# Patient Record
Sex: Female | Born: 1965 | Race: White | Hispanic: No | Marital: Married | Smoking: Never smoker
Health system: Southern US, Community
[De-identification: ages and names within clinical notes are randomized; demographics above are authoritative.]

## PROBLEM LIST (undated history)

## (undated) DIAGNOSIS — I8390 Asymptomatic varicose veins of unspecified lower extremity: Secondary | ICD-10-CM

## (undated) DIAGNOSIS — G43909 Migraine, unspecified, not intractable, without status migrainosus: Secondary | ICD-10-CM

## (undated) DIAGNOSIS — K589 Irritable bowel syndrome without diarrhea: Secondary | ICD-10-CM

## (undated) DIAGNOSIS — J189 Pneumonia, unspecified organism: Secondary | ICD-10-CM

## (undated) DIAGNOSIS — J45909 Unspecified asthma, uncomplicated: Secondary | ICD-10-CM

## (undated) HISTORY — DX: Pneumonia, unspecified organism: J18.9

## (undated) HISTORY — DX: Migraine, unspecified, not intractable, without status migrainosus: G43.909

## (undated) HISTORY — DX: Asymptomatic varicose veins of unspecified lower extremity: I83.90

## (undated) HISTORY — DX: Unspecified asthma, uncomplicated: J45.909

## (undated) HISTORY — DX: Irritable bowel syndrome, unspecified: K58.9

## (undated) HISTORY — PX: WISDOM TOOTH EXTRACTION: SHX21

---

## 2002-04-13 ENCOUNTER — Other Ambulatory Visit: Admission: RE | Admit: 2002-04-13 | Discharge: 2002-04-13 | Payer: Self-pay | Admitting: Obstetrics & Gynecology

## 2002-05-08 ENCOUNTER — Encounter: Admission: RE | Admit: 2002-05-08 | Discharge: 2002-06-29 | Payer: Self-pay | Admitting: Internal Medicine

## 2003-08-08 ENCOUNTER — Other Ambulatory Visit: Admission: RE | Admit: 2003-08-08 | Discharge: 2003-08-08 | Payer: Self-pay | Admitting: Obstetrics and Gynecology

## 2010-04-10 ENCOUNTER — Encounter: Admission: RE | Admit: 2010-04-10 | Discharge: 2010-04-10 | Payer: Self-pay | Admitting: Obstetrics and Gynecology

## 2010-04-22 ENCOUNTER — Encounter: Admission: RE | Admit: 2010-04-22 | Discharge: 2010-04-22 | Payer: Self-pay | Admitting: Obstetrics and Gynecology

## 2011-04-27 ENCOUNTER — Other Ambulatory Visit: Payer: Self-pay | Admitting: Obstetrics and Gynecology

## 2011-04-27 DIAGNOSIS — Z1231 Encounter for screening mammogram for malignant neoplasm of breast: Secondary | ICD-10-CM

## 2011-06-03 ENCOUNTER — Ambulatory Visit
Admission: RE | Admit: 2011-06-03 | Discharge: 2011-06-03 | Disposition: A | Discharge status: Home / Self Care | Payer: Managed Care, Other (non HMO) | Source: Ambulatory Visit | Attending: Obstetrics and Gynecology | Admitting: Obstetrics and Gynecology

## 2011-06-03 DIAGNOSIS — Z1231 Encounter for screening mammogram for malignant neoplasm of breast: Secondary | ICD-10-CM

## 2012-05-26 ENCOUNTER — Other Ambulatory Visit: Payer: Self-pay | Admitting: Obstetrics and Gynecology

## 2012-05-26 DIAGNOSIS — Z1231 Encounter for screening mammogram for malignant neoplasm of breast: Secondary | ICD-10-CM

## 2012-07-04 ENCOUNTER — Ambulatory Visit
Admission: RE | Admit: 2012-07-04 | Discharge: 2012-07-04 | Disposition: A | Discharge status: Home / Self Care | Payer: Managed Care, Other (non HMO) | Source: Ambulatory Visit | Attending: Obstetrics and Gynecology | Admitting: Obstetrics and Gynecology

## 2012-07-04 DIAGNOSIS — Z1231 Encounter for screening mammogram for malignant neoplasm of breast: Secondary | ICD-10-CM

## 2013-06-28 ENCOUNTER — Other Ambulatory Visit: Payer: Self-pay

## 2013-06-28 DIAGNOSIS — Z1231 Encounter for screening mammogram for malignant neoplasm of breast: Secondary | ICD-10-CM

## 2013-07-18 ENCOUNTER — Ambulatory Visit
Admission: RE | Admit: 2013-07-18 | Discharge: 2013-07-18 | Disposition: A | Discharge status: Home / Self Care | Payer: Managed Care, Other (non HMO) | Source: Ambulatory Visit

## 2013-07-18 DIAGNOSIS — Z1231 Encounter for screening mammogram for malignant neoplasm of breast: Secondary | ICD-10-CM

## 2013-09-01 ENCOUNTER — Other Ambulatory Visit: Payer: Self-pay | Admitting: Family

## 2013-09-01 ENCOUNTER — Ambulatory Visit
Admission: RE | Admit: 2013-09-01 | Discharge: 2013-09-01 | Disposition: A | Discharge status: Home / Self Care | Payer: Managed Care, Other (non HMO) | Source: Ambulatory Visit | Attending: Family | Admitting: Family

## 2013-09-01 DIAGNOSIS — R509 Fever, unspecified: Secondary | ICD-10-CM

## 2013-09-01 DIAGNOSIS — R059 Cough, unspecified: Secondary | ICD-10-CM

## 2013-09-01 DIAGNOSIS — R05 Cough: Secondary | ICD-10-CM

## 2014-06-05 ENCOUNTER — Other Ambulatory Visit: Payer: Self-pay | Admitting: *Deleted

## 2014-06-05 ENCOUNTER — Encounter: Payer: Self-pay | Admitting: Vascular Surgery

## 2014-06-05 DIAGNOSIS — I83812 Varicose veins of left lower extremities with pain: Secondary | ICD-10-CM

## 2014-06-14 ENCOUNTER — Encounter: Payer: Managed Care, Other (non HMO) | Admitting: Vascular Surgery

## 2014-06-14 ENCOUNTER — Encounter: Payer: Self-pay | Admitting: Vascular Surgery

## 2014-06-14 ENCOUNTER — Encounter (HOSPITAL_COMMUNITY): Payer: Managed Care, Other (non HMO)

## 2014-06-18 ENCOUNTER — Encounter: Payer: Self-pay | Admitting: Vascular Surgery

## 2014-06-18 ENCOUNTER — Ambulatory Visit (HOSPITAL_COMMUNITY)
Admission: RE | Admit: 2014-06-18 | Discharge: 2014-06-18 | Disposition: A | Discharge status: Home / Self Care | Payer: Managed Care, Other (non HMO) | Source: Ambulatory Visit | Attending: Vascular Surgery | Admitting: Vascular Surgery

## 2014-06-18 ENCOUNTER — Ambulatory Visit (INDEPENDENT_AMBULATORY_CARE_PROVIDER_SITE_OTHER): Payer: Managed Care, Other (non HMO) | Admitting: Vascular Surgery

## 2014-06-18 VITALS — BP 99/67 | HR 57 | Resp 14 | Ht 67.0 in | Wt 148.0 lb

## 2014-06-18 DIAGNOSIS — I83812 Varicose veins of left lower extremities with pain: Secondary | ICD-10-CM | POA: Insufficient documentation

## 2014-06-18 DIAGNOSIS — I83892 Varicose veins of left lower extremities with other complications: Secondary | ICD-10-CM

## 2014-06-18 NOTE — Progress Notes (Signed)
Subjective:     Patient ID: Latoya Roberts, female   DOB: 11/11/1965, 48 y.o.   MRN: [id number]  HPI this 48 year old female is evaluated for painful varicosities in the left leg. 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 She has aching discomfort behind the knees which worsens the more she stands or sits. Being in the supine position with legs elevated relieves her symptoms and walking also helps. She has no history of DVT, thrombophlebitis, stasis ulcers, or bleeding. She does not were elastic compression stockings. Her symptoms seem to worsen as the day progresses.  Past Medical History  Diagnosis Date  . Migraine headache   . Varicose veins     History  Substance Use Topics  . Smoking status: Never Smoker   . Smokeless tobacco: Never Used  . Alcohol Use: 0.0 oz/week    0 Not specified per week    Family History  Problem Relation Age of Onset  . Osteopenia Mother   . Hyperlipidemia Father   . Diabetes Maternal Grandfather     Allergies  Allergen Reactions  . Penicillins Rash    Current outpatient prescriptions: CALCIUM PO, Take by mouth daily., Disp: , Rfl: ;  Cholecalciferol (VITAMIN D PO), Take by mouth daily., Disp: , Rfl: ;  rizatriptan (MAXALT) 10 MG tablet, Take 10 mg by mouth daily as needed for migraine. May repeat in 2 hours if needed, Disp: , Rfl:   BP 99/67 mmHg  Pulse 57  Resp 14  Ht 5\' 7"  (1.702 m)  Wt 148 lb (67.132 kg)  BMI 23.17 kg/m2  Body mass index is 23.17 kg/(m^2).         Review of Systems denies chest pain, dyspnea on exertion, PND, orthopnea, hemoptysis, lateralizing weakness, aphasia,   amaurosis fugax, claudication. Patient does have occasional migraine headaches. Other systems negative and a complete review of systems Objective:   Physical Exam BP 99/67 mmHg  Pulse 57  Resp 14  Ht 5\' 7"  (1.702 m)  Wt 148 lb (67.132 kg)  BMI 23.17 kg/m2  Gen.-alert and oriented x3  in no apparent distress HEENT normal for age Lungs no rhonchi or wheezing Cardiovascular regular rhythm no murmurs carotid pulses 3+ palpable no bruits audible Abdomen soft nontender no palpable masses Musculoskeletal free of  major deformities Skin clear -no rashes Neurologic normal Lower extremities 3+ femoral and dorsalis pedis pulses palpable bilaterally with no edema Left leg has prominent nest of varicosities in the mid thigh medially and in the mid calf and distally beginning 15 cm above the medial malleolus extending down onto the lateral aspect of the foot. Mild edema. No hyperpigmentation or ulceration is noted. Also some prominent reticular veins in the anterior thigh.  Today I ordered a venous duplex exam of the left leg which I reviewed and interpreted. There is no DVT. There is gross reflux in the left great saphenous vein supplying these bulging varicosities and there is also some deep vein reflux.       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 Patient symptoms are affecting her daily living.    Plan:         #1 long leg elastic compression stockings 20-30 mm gradient #2 elevate legs as much as possible #3 ibuprofen daily on a regular basis for pain #4 return in 3 months-if no significant improvement then she would need #  1 laser ablation left great saphenous vein plus greater than 20 stab phlebectomy of painful varicosities followed by one or 2 courses of sclerotherapy Return in 3 months

## 2014-07-17 ENCOUNTER — Other Ambulatory Visit: Payer: Self-pay

## 2014-07-17 DIAGNOSIS — Z1231 Encounter for screening mammogram for malignant neoplasm of breast: Secondary | ICD-10-CM

## 2014-07-26 ENCOUNTER — Encounter (INDEPENDENT_AMBULATORY_CARE_PROVIDER_SITE_OTHER): Payer: Self-pay

## 2014-07-26 ENCOUNTER — Ambulatory Visit
Admission: RE | Admit: 2014-07-26 | Discharge: 2014-07-26 | Disposition: A | Discharge status: Home / Self Care | Payer: BLUE CROSS/BLUE SHIELD | Source: Ambulatory Visit

## 2014-07-26 DIAGNOSIS — Z1231 Encounter for screening mammogram for malignant neoplasm of breast: Secondary | ICD-10-CM

## 2014-09-17 ENCOUNTER — Encounter: Payer: Self-pay | Admitting: Vascular Surgery

## 2014-09-18 ENCOUNTER — Ambulatory Visit: Payer: Managed Care, Other (non HMO) | Admitting: Vascular Surgery

## 2017-12-20 DIAGNOSIS — Z6825 Body mass index (BMI) 25.0-25.9, adult: Secondary | ICD-10-CM | POA: Diagnosis not present

## 2017-12-20 DIAGNOSIS — Z1231 Encounter for screening mammogram for malignant neoplasm of breast: Secondary | ICD-10-CM | POA: Diagnosis not present

## 2017-12-20 DIAGNOSIS — Z01419 Encounter for gynecological examination (general) (routine) without abnormal findings: Secondary | ICD-10-CM | POA: Diagnosis not present

## 2017-12-20 DIAGNOSIS — Z1151 Encounter for screening for human papillomavirus (HPV): Secondary | ICD-10-CM | POA: Diagnosis not present

## 2018-02-23 DIAGNOSIS — F4323 Adjustment disorder with mixed anxiety and depressed mood: Secondary | ICD-10-CM | POA: Diagnosis not present

## 2018-03-02 DIAGNOSIS — F438 Other reactions to severe stress: Secondary | ICD-10-CM | POA: Diagnosis not present

## 2018-03-08 DIAGNOSIS — D2371 Other benign neoplasm of skin of right lower limb, including hip: Secondary | ICD-10-CM | POA: Diagnosis not present

## 2018-03-08 DIAGNOSIS — L570 Actinic keratosis: Secondary | ICD-10-CM | POA: Diagnosis not present

## 2018-03-08 DIAGNOSIS — L814 Other melanin hyperpigmentation: Secondary | ICD-10-CM | POA: Diagnosis not present

## 2018-03-08 DIAGNOSIS — D485 Neoplasm of uncertain behavior of skin: Secondary | ICD-10-CM | POA: Diagnosis not present

## 2018-03-10 DIAGNOSIS — F438 Other reactions to severe stress: Secondary | ICD-10-CM | POA: Diagnosis not present

## 2018-03-23 DIAGNOSIS — F438 Other reactions to severe stress: Secondary | ICD-10-CM | POA: Diagnosis not present

## 2018-03-31 DIAGNOSIS — F438 Other reactions to severe stress: Secondary | ICD-10-CM | POA: Diagnosis not present

## 2018-04-27 DIAGNOSIS — F438 Other reactions to severe stress: Secondary | ICD-10-CM | POA: Diagnosis not present

## 2018-04-28 DIAGNOSIS — D1801 Hemangioma of skin and subcutaneous tissue: Secondary | ICD-10-CM | POA: Diagnosis not present

## 2018-04-28 DIAGNOSIS — D2272 Melanocytic nevi of left lower limb, including hip: Secondary | ICD-10-CM | POA: Diagnosis not present

## 2018-04-28 DIAGNOSIS — L905 Scar conditions and fibrosis of skin: Secondary | ICD-10-CM | POA: Diagnosis not present

## 2018-04-28 DIAGNOSIS — L814 Other melanin hyperpigmentation: Secondary | ICD-10-CM | POA: Diagnosis not present

## 2018-05-06 DIAGNOSIS — F438 Other reactions to severe stress: Secondary | ICD-10-CM | POA: Diagnosis not present

## 2019-01-05 DIAGNOSIS — Z13220 Encounter for screening for lipoid disorders: Secondary | ICD-10-CM | POA: Diagnosis not present

## 2019-01-05 DIAGNOSIS — Z6824 Body mass index (BMI) 24.0-24.9, adult: Secondary | ICD-10-CM | POA: Diagnosis not present

## 2019-01-05 DIAGNOSIS — Z130 Encounter for screening for diseases of the blood and blood-forming organs and certain disorders involving the immune mechanism: Secondary | ICD-10-CM | POA: Diagnosis not present

## 2019-01-05 DIAGNOSIS — Z01419 Encounter for gynecological examination (general) (routine) without abnormal findings: Secondary | ICD-10-CM | POA: Diagnosis not present

## 2019-01-05 DIAGNOSIS — Z1231 Encounter for screening mammogram for malignant neoplasm of breast: Secondary | ICD-10-CM | POA: Diagnosis not present

## 2019-01-05 DIAGNOSIS — Z1329 Encounter for screening for other suspected endocrine disorder: Secondary | ICD-10-CM | POA: Diagnosis not present

## 2019-01-05 DIAGNOSIS — Z0000 Encounter for general adult medical examination without abnormal findings: Secondary | ICD-10-CM | POA: Diagnosis not present

## 2019-01-09 DIAGNOSIS — H0015 Chalazion left lower eyelid: Secondary | ICD-10-CM | POA: Diagnosis not present

## 2019-01-09 DIAGNOSIS — H0102B Squamous blepharitis left eye, upper and lower eyelids: Secondary | ICD-10-CM | POA: Diagnosis not present

## 2019-01-09 DIAGNOSIS — H0102A Squamous blepharitis right eye, upper and lower eyelids: Secondary | ICD-10-CM | POA: Diagnosis not present

## 2019-05-04 DIAGNOSIS — Z23 Encounter for immunization: Secondary | ICD-10-CM | POA: Diagnosis not present

## 2019-05-04 DIAGNOSIS — D2272 Melanocytic nevi of left lower limb, including hip: Secondary | ICD-10-CM | POA: Diagnosis not present

## 2019-05-04 DIAGNOSIS — L814 Other melanin hyperpigmentation: Secondary | ICD-10-CM | POA: Diagnosis not present

## 2019-05-04 DIAGNOSIS — D1801 Hemangioma of skin and subcutaneous tissue: Secondary | ICD-10-CM | POA: Diagnosis not present

## 2019-07-13 DIAGNOSIS — M545 Low back pain: Secondary | ICD-10-CM | POA: Diagnosis not present

## 2019-07-13 DIAGNOSIS — R946 Abnormal results of thyroid function studies: Secondary | ICD-10-CM | POA: Diagnosis not present

## 2019-07-13 DIAGNOSIS — Z1331 Encounter for screening for depression: Secondary | ICD-10-CM | POA: Diagnosis not present

## 2019-07-13 DIAGNOSIS — G43909 Migraine, unspecified, not intractable, without status migrainosus: Secondary | ICD-10-CM | POA: Diagnosis not present

## 2019-07-14 ENCOUNTER — Other Ambulatory Visit: Payer: Self-pay | Admitting: Internal Medicine

## 2019-07-14 ENCOUNTER — Ambulatory Visit
Admission: RE | Admit: 2019-07-14 | Discharge: 2019-07-14 | Disposition: A | Discharge status: Home / Self Care | Payer: BC Managed Care – PPO | Source: Ambulatory Visit | Attending: Internal Medicine | Admitting: Internal Medicine

## 2019-07-14 DIAGNOSIS — M545 Low back pain, unspecified: Secondary | ICD-10-CM

## 2019-07-14 DIAGNOSIS — M25551 Pain in right hip: Secondary | ICD-10-CM | POA: Diagnosis not present

## 2019-07-19 DIAGNOSIS — R945 Abnormal results of liver function studies: Secondary | ICD-10-CM | POA: Diagnosis not present

## 2019-07-19 DIAGNOSIS — G43909 Migraine, unspecified, not intractable, without status migrainosus: Secondary | ICD-10-CM | POA: Diagnosis not present

## 2019-07-21 DIAGNOSIS — M545 Low back pain: Secondary | ICD-10-CM | POA: Diagnosis not present

## 2019-07-28 DIAGNOSIS — M545 Low back pain: Secondary | ICD-10-CM | POA: Diagnosis not present

## 2019-08-02 DIAGNOSIS — M545 Low back pain: Secondary | ICD-10-CM | POA: Diagnosis not present

## 2019-08-18 DIAGNOSIS — M545 Low back pain: Secondary | ICD-10-CM | POA: Diagnosis not present

## 2019-08-25 DIAGNOSIS — M545 Low back pain: Secondary | ICD-10-CM | POA: Diagnosis not present

## 2019-09-06 DIAGNOSIS — M545 Low back pain: Secondary | ICD-10-CM | POA: Diagnosis not present

## 2019-09-29 DIAGNOSIS — M545 Low back pain: Secondary | ICD-10-CM | POA: Diagnosis not present

## 2019-10-05 DIAGNOSIS — E038 Other specified hypothyroidism: Secondary | ICD-10-CM | POA: Diagnosis not present

## 2019-10-05 DIAGNOSIS — Z0000 Encounter for general adult medical examination without abnormal findings: Secondary | ICD-10-CM | POA: Diagnosis not present

## 2019-10-10 DIAGNOSIS — K921 Melena: Secondary | ICD-10-CM | POA: Diagnosis not present

## 2019-10-12 DIAGNOSIS — R195 Other fecal abnormalities: Secondary | ICD-10-CM | POA: Diagnosis not present

## 2019-10-12 DIAGNOSIS — M545 Low back pain: Secondary | ICD-10-CM | POA: Diagnosis not present

## 2019-10-12 DIAGNOSIS — Z0000 Encounter for general adult medical examination without abnormal findings: Secondary | ICD-10-CM | POA: Diagnosis not present

## 2019-10-12 DIAGNOSIS — E038 Other specified hypothyroidism: Secondary | ICD-10-CM | POA: Diagnosis not present

## 2019-10-12 DIAGNOSIS — G43909 Migraine, unspecified, not intractable, without status migrainosus: Secondary | ICD-10-CM | POA: Diagnosis not present

## 2019-10-13 DIAGNOSIS — M545 Low back pain: Secondary | ICD-10-CM | POA: Diagnosis not present

## 2019-10-16 ENCOUNTER — Encounter: Payer: Self-pay | Admitting: Gastroenterology

## 2019-10-20 DIAGNOSIS — G43909 Migraine, unspecified, not intractable, without status migrainosus: Secondary | ICD-10-CM | POA: Insufficient documentation

## 2019-10-20 DIAGNOSIS — E039 Hypothyroidism, unspecified: Secondary | ICD-10-CM | POA: Insufficient documentation

## 2019-10-20 DIAGNOSIS — R195 Other fecal abnormalities: Secondary | ICD-10-CM | POA: Insufficient documentation

## 2019-10-24 ENCOUNTER — Other Ambulatory Visit: Payer: Self-pay

## 2019-10-24 ENCOUNTER — Encounter: Payer: Self-pay | Admitting: Gastroenterology

## 2019-10-24 ENCOUNTER — Ambulatory Visit (INDEPENDENT_AMBULATORY_CARE_PROVIDER_SITE_OTHER): Payer: BC Managed Care – PPO | Admitting: Gastroenterology

## 2019-10-24 VITALS — BP 94/66 | HR 80 | Temp 98.0°F | Ht 66.5 in | Wt 159.1 lb

## 2019-10-24 DIAGNOSIS — M545 Low back pain: Secondary | ICD-10-CM | POA: Diagnosis not present

## 2019-10-24 DIAGNOSIS — R195 Other fecal abnormalities: Secondary | ICD-10-CM

## 2019-10-24 MED ORDER — SUPREP BOWEL PREP KIT 17.5-3.13-1.6 GM/177ML PO SOLN: 1.0000 | ORAL | 0 refills | Status: DC

## 2019-10-24 NOTE — Progress Notes (Signed)
HPI: This is a very pleasant 54 year old woman who was referred to me by Melida Quitter, MD  to evaluate Hemoccult positive stool.    As part of a routine physical she had Hemoccult testing of her stool and it was positive.  She never sees overt blood in her stool.  She does have very mild chronic constipation.  She has tried fiber supplements in the past and they tend to disagree with her.  Her weight is overall stable.  She has been having some low back pains and right hip pains but no abdominal pains.  Colon cancer does not run in her family  Old Data Reviewed:  Lab tests through PCP April 2021 show normal CBC, normal complete metabolic profile.  Hemosure positive stool.  Review of systems: Pertinent positive and negative review of systems were noted in the above HPI section. All other review negative.   Past Medical History:  Diagnosis Date  . Asthma   . IBS (irritable bowel syndrome)   . Migraine headache   . Pneumonia   . Varicose veins     Past Surgical History:  Procedure Laterality Date  . WISDOM TOOTH EXTRACTION      Current Outpatient Medications  Medication Sig Dispense Refill  . CALCIUM PO Take by mouth daily.    . Cholecalciferol (VITAMIN D PO) Take by mouth daily.    Marland Kitchen levothyroxine (SYNTHROID) 25 MCG tablet Take 25 mcg by mouth daily.    . meloxicam (MOBIC) 7.5 MG tablet Take 7.5 mg by mouth as needed.    . rizatriptan (MAXALT) 10 MG tablet Take 10 mg by mouth as needed for migraine. May repeat in 2 hours if needed      No current facility-administered medications for this visit.    Allergies as of 10/24/2019 - Review Complete 10/24/2019  Allergen Reaction Noted  . Penicillins Rash 06/05/2014    Family History  Problem Relation Age of Onset  . Osteopenia Mother   . Atrial fibrillation Mother   . Hyperlipidemia Father   . Hypertension Father   . Diabetes Maternal Grandfather   . Colon cancer Neg Hx     Social History   Socioeconomic History   . Marital status: Married    Spouse name: Not on file  . Number of children: 3  . Years of education: Not on file  . Highest education level: Not on file  Occupational History  . Occupation: Catering manager  Tobacco Use  . Smoking status: Never Smoker  . Smokeless tobacco: Never Used  Substance and Sexual Activity  . Alcohol use: Yes    Alcohol/week: 0.0 standard drinks    Comment: occasional  . Drug use: No  . Sexual activity: Not on file  Other Topics Concern  . Not on file  Social History Narrative  . Not on file   Social Determinants of Health   Financial Resource Strain:   . Difficulty of Paying Living Expenses:   Food Insecurity:   . Worried About Programme researcher, broadcasting/film/video in the Last Year:   . Barista in the Last Year:   Transportation Needs:   . Freight forwarder (Medical):   Marland Kitchen Lack of Transportation (Non-Medical):   Physical Activity:   . Days of Exercise per Week:   . Minutes of Exercise per Session:   Stress:   . Feeling of Stress :   Social Connections:   . Frequency of Communication with Friends and Family:   .  Frequency of Social Gatherings with Friends and Family:   . Attends Religious Services:   . Active Member of Clubs or Organizations:   . Attends Archivist Meetings:   Marland Kitchen Marital Status:   Intimate Partner Violence:   . Fear of Current or Ex-Partner:   . Emotionally Abused:   Marland Kitchen Physically Abused:   . Sexually Abused:      Physical Exam: BP 94/66 (BP Location: Left Arm, Patient Position: Sitting, Cuff Size: Normal)   Pulse 80   Temp 98 F (36.7 C)   Ht 5' 6.5" (1.689 m) Comment: height measured without shoes  Wt 159 lb 2 oz (72.2 kg)   LMP 05/10/2011   BMI 25.30 kg/m  Constitutional: generally well-appearing Psychiatric: alert and oriented x3 Eyes: extraocular movements intact Mouth: oral pharynx moist, no lesions Neck: supple no lymphadenopathy Cardiovascular: heart regular rate and rhythm Lungs: clear to auscultation  bilaterally Abdomen: soft, nontender, nondistended, no obvious ascites, no peritoneal signs, normal bowel sounds Extremities: no lower extremity edema bilaterally Skin: no lesions on visible extremities   Assessment and plan: 54 y.o. female with Hemoccult positive stool  We had a nice discussion about colon cancer screening.  I recommended colonoscopy given her Hemoccult positive stool.  She understands her risks to the procedure including perforation, bleeding, missing a cancer but they are all very unlikely and we will take care of her if any of these complications occur.  She agreed to go ahead with a colonoscopy.  I see no reason for any further blood tests or imaging studies prior to then.   Please see the "Patient Instructions" section for addition details about the plan.   Owens Loffler, MD [facility] 10/24/2019, 8:20 AM  Cc: Michael Boston, MD  Total time on date of encounter was 30  minutes (this included time spent preparing to see the patient reviewing records; obtaining and/or reviewing separately obtained history; performing a medically appropriate exam and/or evaluation; counseling and educating the patient and family if present; ordering medications, tests or procedures if applicable; and documenting clinical information in the health record).

## 2019-10-24 NOTE — Patient Instructions (Signed)
If you are age 53 or older, your body mass index should be between 23-30. Your Body mass index is 25.3 kg/m. If this is out of the aforementioned range listed, please consider follow up with your Primary Care Provider.  If you are age 39 or younger, your body mass index should be between 19-25. Your Body mass index is 25.3 kg/m. If this is out of the aformentioned range listed, please consider follow up with your Primary Care Provider.   You have been scheduled for a colonoscopy. Please follow written instructions given to you at your visit today.  Please pick up your prep supplies at the pharmacy within the next 1-3 days. If you use inhalers (even only as needed), please bring them with you on the day of your procedure.  Thank you for entrusting me with your care and choosing [facility].  Dr Christella Hartigan

## 2019-11-01 DIAGNOSIS — M545 Low back pain: Secondary | ICD-10-CM | POA: Diagnosis not present

## 2019-11-03 ENCOUNTER — Other Ambulatory Visit: Payer: Self-pay

## 2019-11-03 ENCOUNTER — Encounter: Payer: Self-pay | Admitting: Gastroenterology

## 2019-11-03 ENCOUNTER — Ambulatory Visit (AMBULATORY_SURGERY_CENTER): Payer: BC Managed Care – PPO | Admitting: Gastroenterology

## 2019-11-03 VITALS — BP 94/45 | HR 51 | Temp 96.6°F | Resp 14 | Ht 66.0 in | Wt 159.0 lb

## 2019-11-03 DIAGNOSIS — R195 Other fecal abnormalities: Secondary | ICD-10-CM

## 2019-11-03 MED ORDER — SODIUM CHLORIDE 0.9 % IV SOLN: 500.0000 mL | Freq: Once | INTRAVENOUS | Status: DC

## 2019-11-03 NOTE — Op Note (Addendum)
[facility] Patient Name: Latoya Roberts Procedure Date: 11/03/2019 10:26 AM MRN: [id number] Endoscopist: Milus Banister , MD Age: 54 Referring MD:  Date of Birth: 12/22/1965 Gender: Female Account #: [id number] Procedure:                Colonoscopy Indications:              Heme positive stool Medicines:                Monitored Anesthesia Care Procedure:                Pre-Anesthesia Assessment:                           - Prior to the procedure, a History and Physical                            was performed, and patient medications and                            allergies were reviewed. The patient's tolerance of                            previous anesthesia was also reviewed. The risks                            and benefits of the procedure and the sedation                            options and risks were discussed with the patient.                            All questions were answered, and informed consent                            was obtained. Prior Anticoagulants: The patient has                            taken no previous anticoagulant or antiplatelet                            agents. ASA Grade Assessment: II - A patient with                            mild systemic disease. After reviewing the risks                            and benefits, the patient was deemed in                            satisfactory condition to undergo the procedure.                           After obtaining informed consent, the colonoscope  was passed under direct vision. Throughout the                            procedure, the patient's blood pressure, pulse, and                            oxygen saturations were monitored continuously. The                            Colonoscope was introduced through the anus and                            advanced to the the cecum, identified by                            appendiceal orifice and ileocecal valve. The                        colonoscopy was performed without difficulty. The                            patient tolerated the procedure well. The quality                            of the bowel preparation was good. The ileocecal                            valve, appendiceal orifice, and rectum were                            photographed. Scope In: 10:41:07 AM Scope Out: 10:55:54 AM Scope Withdrawal Time: 0 hours 7 minutes 55 seconds  Total Procedure Duration: 0 hours 14 minutes 47 seconds  Findings:                 The entire examined colon appeared normal on direct                            and retroflexion views. Complications:            No immediate complications. Estimated blood loss:                            None. Estimated Blood Loss:     Estimated blood loss: none. Impression:               - The entire examined colon is normal on direct and                            retroflexion views.                           - No specimens collected. Recommendation:           - Patient has a contact number available for  emergencies. The signs and symptoms of potential                            delayed complications were discussed with the                            patient. Return to normal activities tomorrow.                            Written discharge instructions were provided to the                            patient.                           - Resume previous diet.                           - Continue present medications.                           - Repeat colonoscopy in 10 years for screening. Rachael Fee, MD 11/03/2019 10:58:04 AM This report has been signed electronically.

## 2019-11-03 NOTE — Patient Instructions (Signed)
You don't need another colonoscopy for 10 years!  YOU HAD AN ENDOSCOPIC PROCEDURE TODAY AT THE [facility]:   Refer to the procedure report that was given to you for any specific questions about what was found during the examination.  If the procedure report does not answer your questions, please call your gastroenterologist to clarify.  If you requested that your care partner not be given the details of your procedure findings, then the procedure report has been included in a sealed envelope for you to review at your convenience later.  YOU SHOULD EXPECT: Some feelings of bloating in the abdomen. Passage of more gas than usual.  Walking can help get rid of the air that was put into your GI tract during the procedure and reduce the bloating. If you had a lower endoscopy (such as a colonoscopy or flexible sigmoidoscopy) you may notice spotting of blood in your stool or on the toilet paper. If you underwent a bowel prep for your procedure, you may not have a normal bowel movement for a few days.  Please Note:  You might notice some irritation and congestion in your nose or some drainage.  This is from the oxygen used during your procedure.  There is no need for concern and it should clear up in a day or so.  SYMPTOMS TO REPORT IMMEDIATELY:   Following lower endoscopy (colonoscopy or flexible sigmoidoscopy):  Excessive amounts of blood in the stool  Significant tenderness or worsening of abdominal pains  Swelling of the abdomen that is new, acute  Fever of 100F or higher  For urgent or emergent issues, a gastroenterologist can be reached at any hour by calling [phone]. Do not use MyChart messaging for urgent concerns.    DIET:  We do recommend a small meal at first, but then you may proceed to your regular diet.  Drink plenty of fluids but you should avoid alcoholic beverages for 24 hours.  ACTIVITY:  You should plan to take it easy for the rest of today and you should NOT  DRIVE or use heavy machinery until tomorrow (because of the sedation medicines used during the test).    FOLLOW UP: Our staff will call the number listed on your records 48-72 hours following your procedure to check on you and address any questions or concerns that you may have regarding the information given to you following your procedure. If we do not reach you, we will leave a message.  We will attempt to reach you two times.  During this call, we will ask if you have developed any symptoms of COVID 19. If you develop any symptoms (ie: fever, flu-like symptoms, shortness of breath, cough etc.) before then, please call [phone].  If you test positive for Covid 19 in the 2 weeks post procedure, please call and report this information to [place].    If any biopsies were taken you will be contacted by phone or by letter within the next 1-3 weeks.  Please call us at [phone] if you have not heard about the biopsies in 3 weeks.    SIGNATURES/CONFIDENTIALITY: You and/or your care partner have signed paperwork which will be entered into your electronic medical record.  These signatures attest to the fact that that the information above on your After Visit Summary has been reviewed and is understood.  Full responsibility of the confidentiality of this discharge information lies with you and/or your care-partner.

## 2019-11-03 NOTE — Progress Notes (Signed)
Vitals-CW Temp-JB  Pt's states no medical or surgical changes since previsit or office visit. 

## 2019-11-03 NOTE — Progress Notes (Signed)
To PACU< VSS. Report to [email or web address] 

## 2019-11-07 ENCOUNTER — Telehealth: Payer: Self-pay

## 2019-11-07 NOTE — Telephone Encounter (Signed)
  Follow up Call-  Call back number 11/03/2019  Post procedure Call Back phone  # [phone]  Permission to leave phone message Yes  Some recent data might be hidden     Patient questions:  Do you have a fever, pain , or abdominal swelling? No. Pain Score  0 *  Have you tolerated food without any problems? Yes.    Have you been able to return to your normal activities? Yes.    Do you have any questions about your discharge instructions: Diet   No. Medications  No. Follow up visit  No.  Do you have questions or concerns about your Care? No.  Actions: * If pain score is 4 or above: No action needed, pain <4.   1. Have you developed a fever since your procedure? no  2.   Have you had an respiratory symptoms (SOB or cough) since your procedure? no  3.   Have you tested positive for COVID 19 since your procedure no  4.   Have you had any family members/close contacts diagnosed with the COVID 19 since your procedure?  no   If yes to any of these questions please route to Laverna Peace, RN and Charlett Lango, RN

## 2019-11-10 DIAGNOSIS — M545 Low back pain: Secondary | ICD-10-CM | POA: Diagnosis not present

## 2019-12-06 DIAGNOSIS — M545 Low back pain: Secondary | ICD-10-CM | POA: Diagnosis not present

## 2019-12-20 DIAGNOSIS — M545 Low back pain: Secondary | ICD-10-CM | POA: Diagnosis not present

## 2020-01-04 DIAGNOSIS — M545 Low back pain: Secondary | ICD-10-CM | POA: Diagnosis not present

## 2020-01-18 DIAGNOSIS — M545 Low back pain: Secondary | ICD-10-CM | POA: Diagnosis not present

## 2021-03-31 DIAGNOSIS — Z01419 Encounter for gynecological examination (general) (routine) without abnormal findings: Secondary | ICD-10-CM | POA: Diagnosis not present

## 2021-03-31 DIAGNOSIS — Z6821 Body mass index (BMI) 21.0-21.9, adult: Secondary | ICD-10-CM | POA: Diagnosis not present

## 2021-03-31 DIAGNOSIS — Z124 Encounter for screening for malignant neoplasm of cervix: Secondary | ICD-10-CM | POA: Diagnosis not present

## 2021-03-31 DIAGNOSIS — Z113 Encounter for screening for infections with a predominantly sexual mode of transmission: Secondary | ICD-10-CM | POA: Diagnosis not present

## 2021-03-31 DIAGNOSIS — Z1231 Encounter for screening mammogram for malignant neoplasm of breast: Secondary | ICD-10-CM | POA: Diagnosis not present

## 2021-04-26 IMAGING — CR DG LUMBAR SPINE COMPLETE 4+V
5 series · 5 of 5 positions shown · non-contrast
Comparison: None.

CLINICAL DATA: Low back pain for several years, no known injury,
initial encounter

EXAM:
LUMBAR SPINE - COMPLETE 4+ VIEW

[t l-spine a.p.]
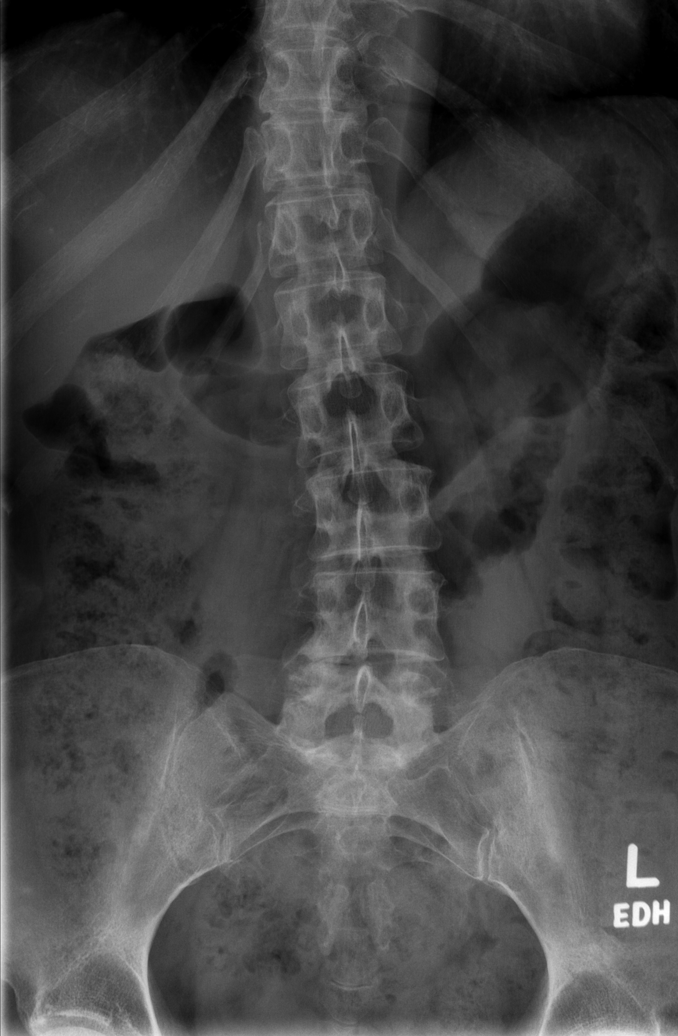

[t l-spine oblique exposure (1 of 2)]
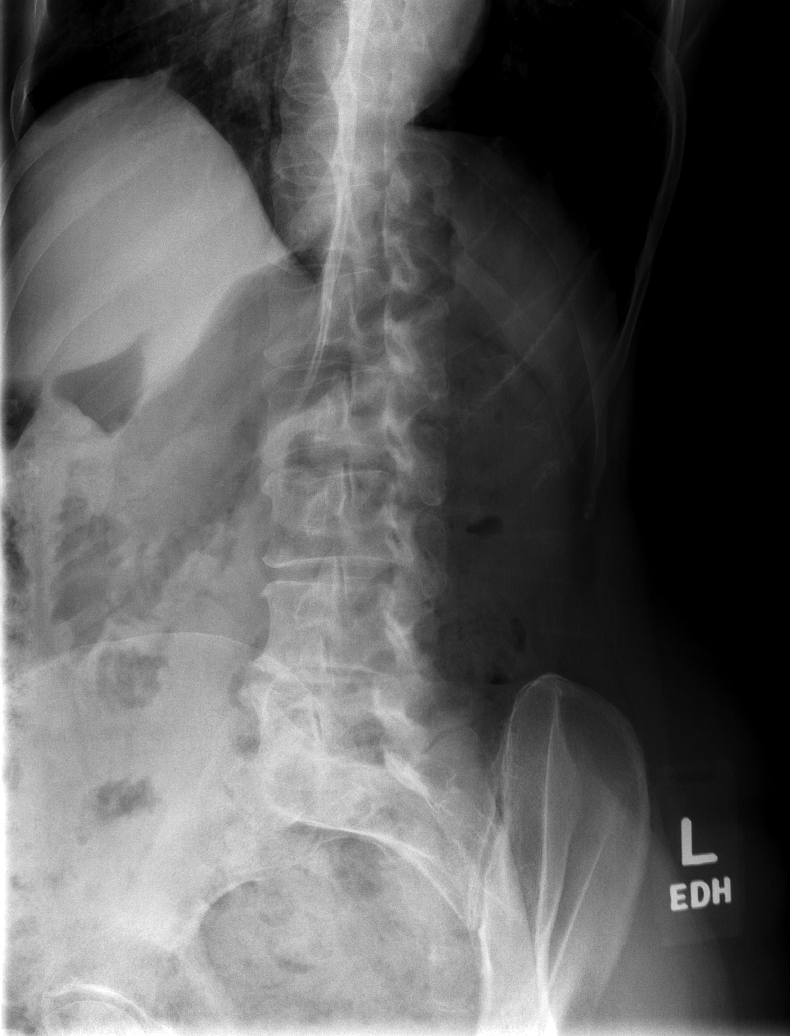

[t l-spine oblique exposure (2 of 2)]
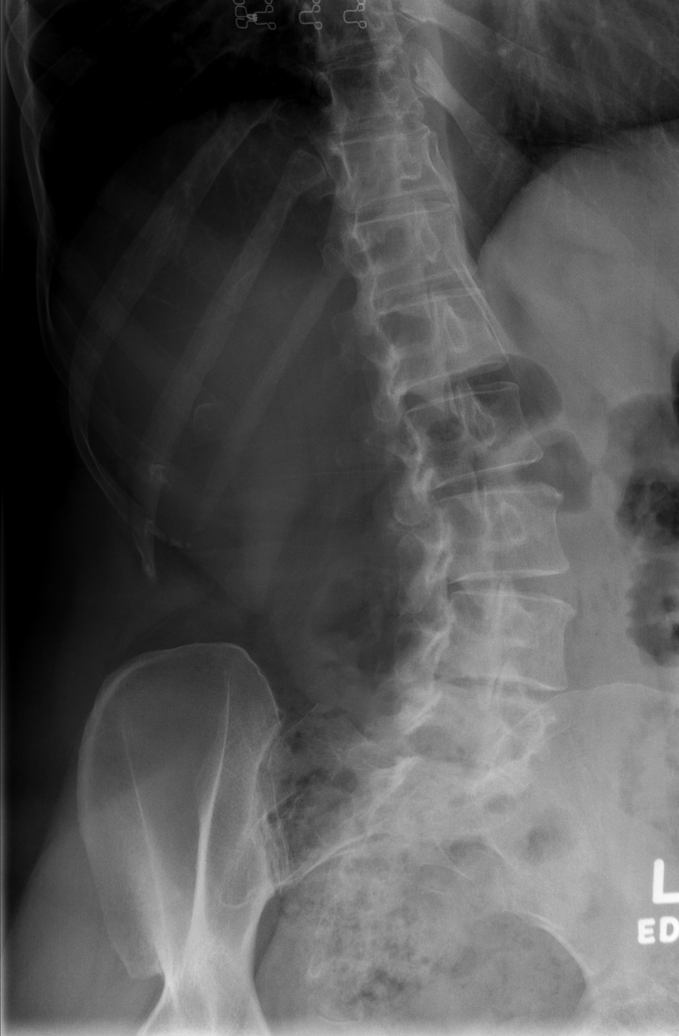

[t l-spine lat]
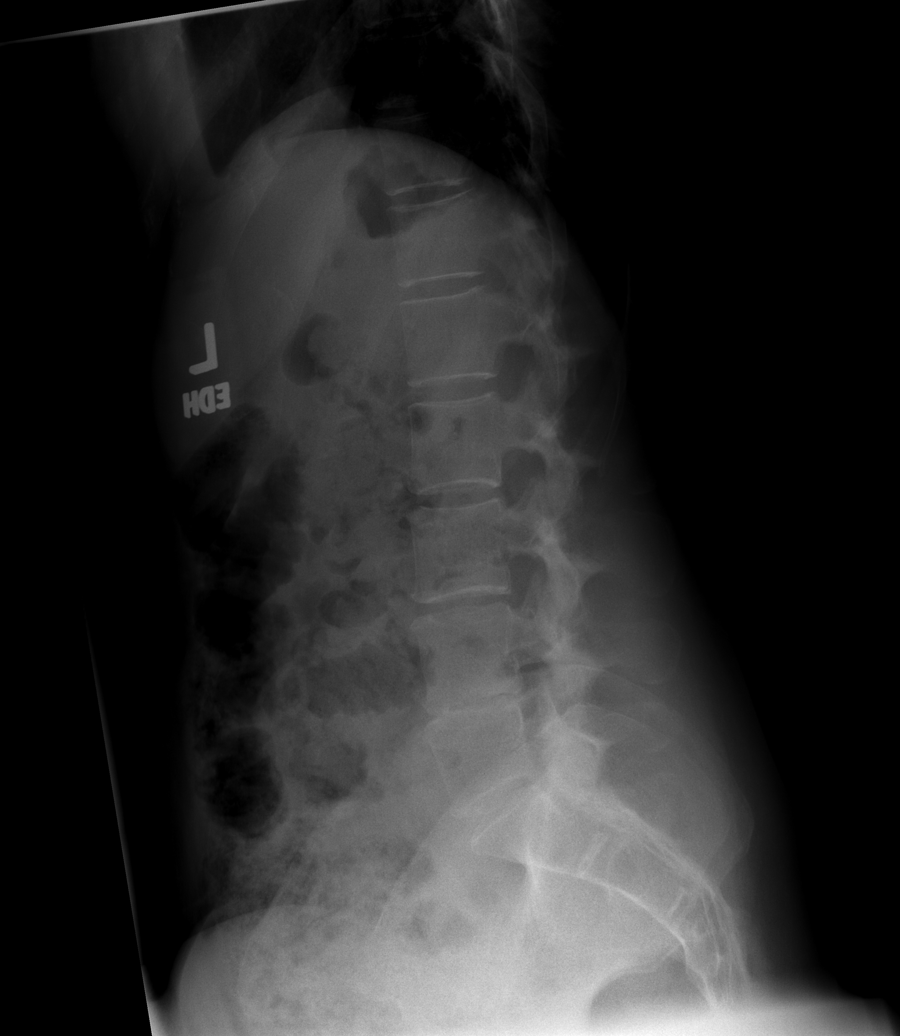

[t l-spine l5-s1 spot]
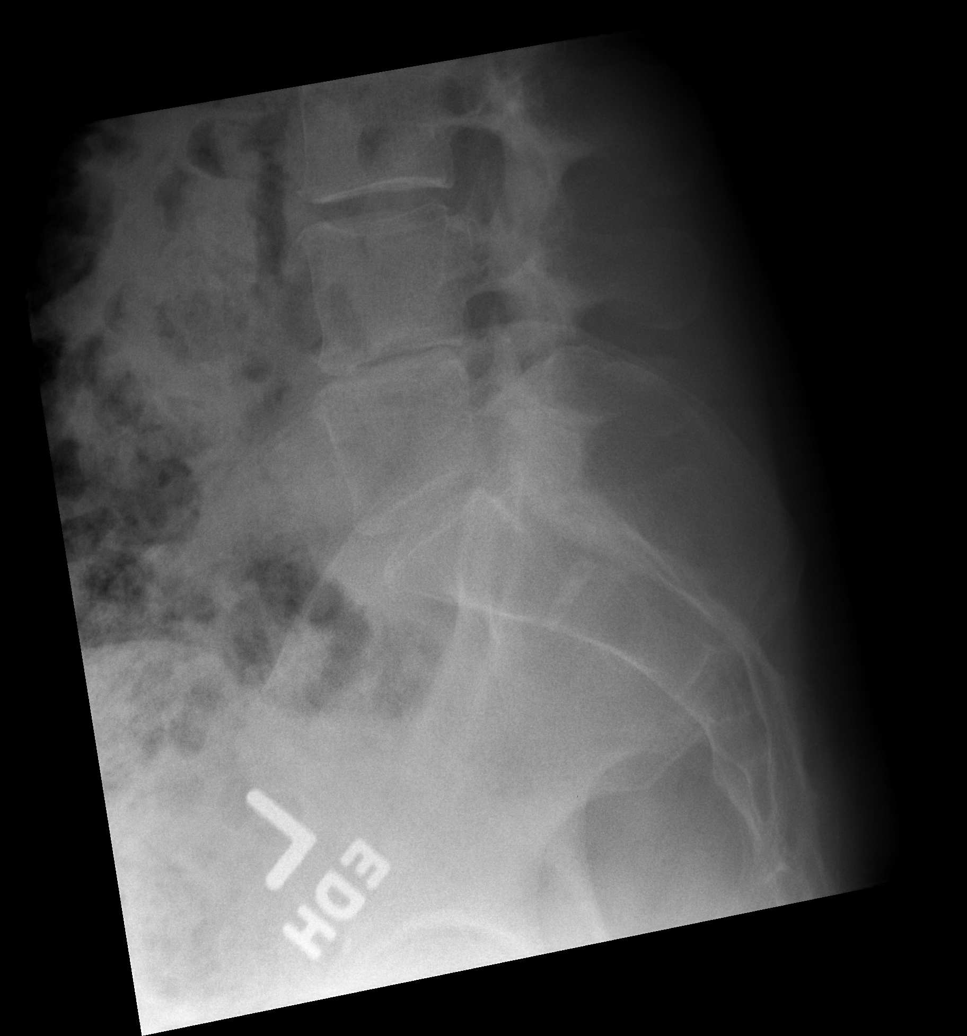

[5 of 5 positions shown; findings below may reference images not displayed]

FINDINGS: Five lumbar type vertebral bodies are well visualized. Very mild
scoliosis concave to the right is noted which appears compensatory
from scoliosis concave to the left in the thoracic spine. Mild
osteophytic changes are seen. Facet hypertrophic changes are noted.
No anterolisthesis is seen. No compression deformity is noted.
IMPRESSION: Mild scoliosis likely compensatory. Mild degenerative changes are
seen.

## 2021-04-26 IMAGING — CR DG HIP (WITH OR WITHOUT PELVIS) 2-3V*R*
2 series · 2 of 2 positions shown · non-contrast
Comparison: None.

CLINICAL DATA: Right hip pain for several years, no known injury,
initial encounter

EXAM:
DG HIP (WITH OR WITHOUT PELVIS) 2V RIGHT

[t hip ap right]
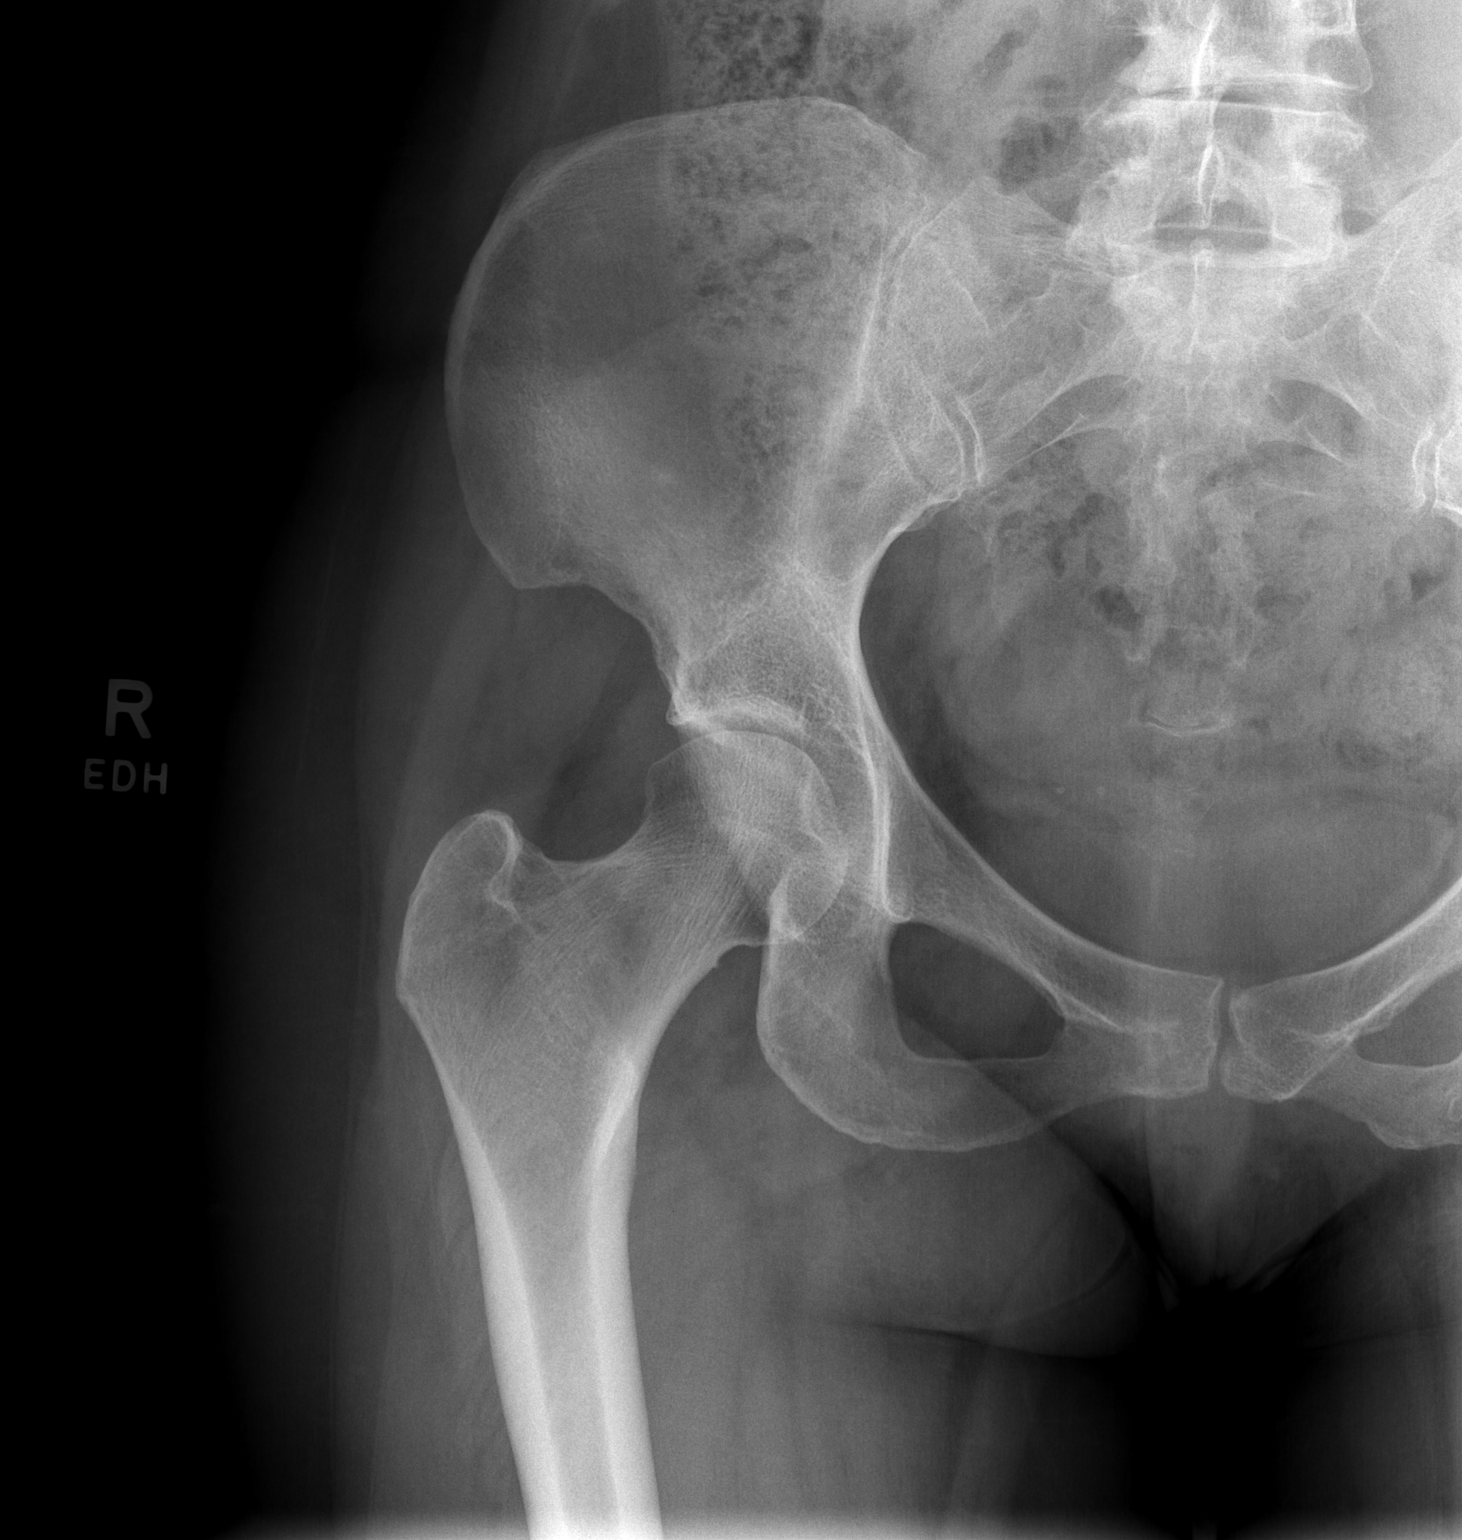

[t hip frog leg right]
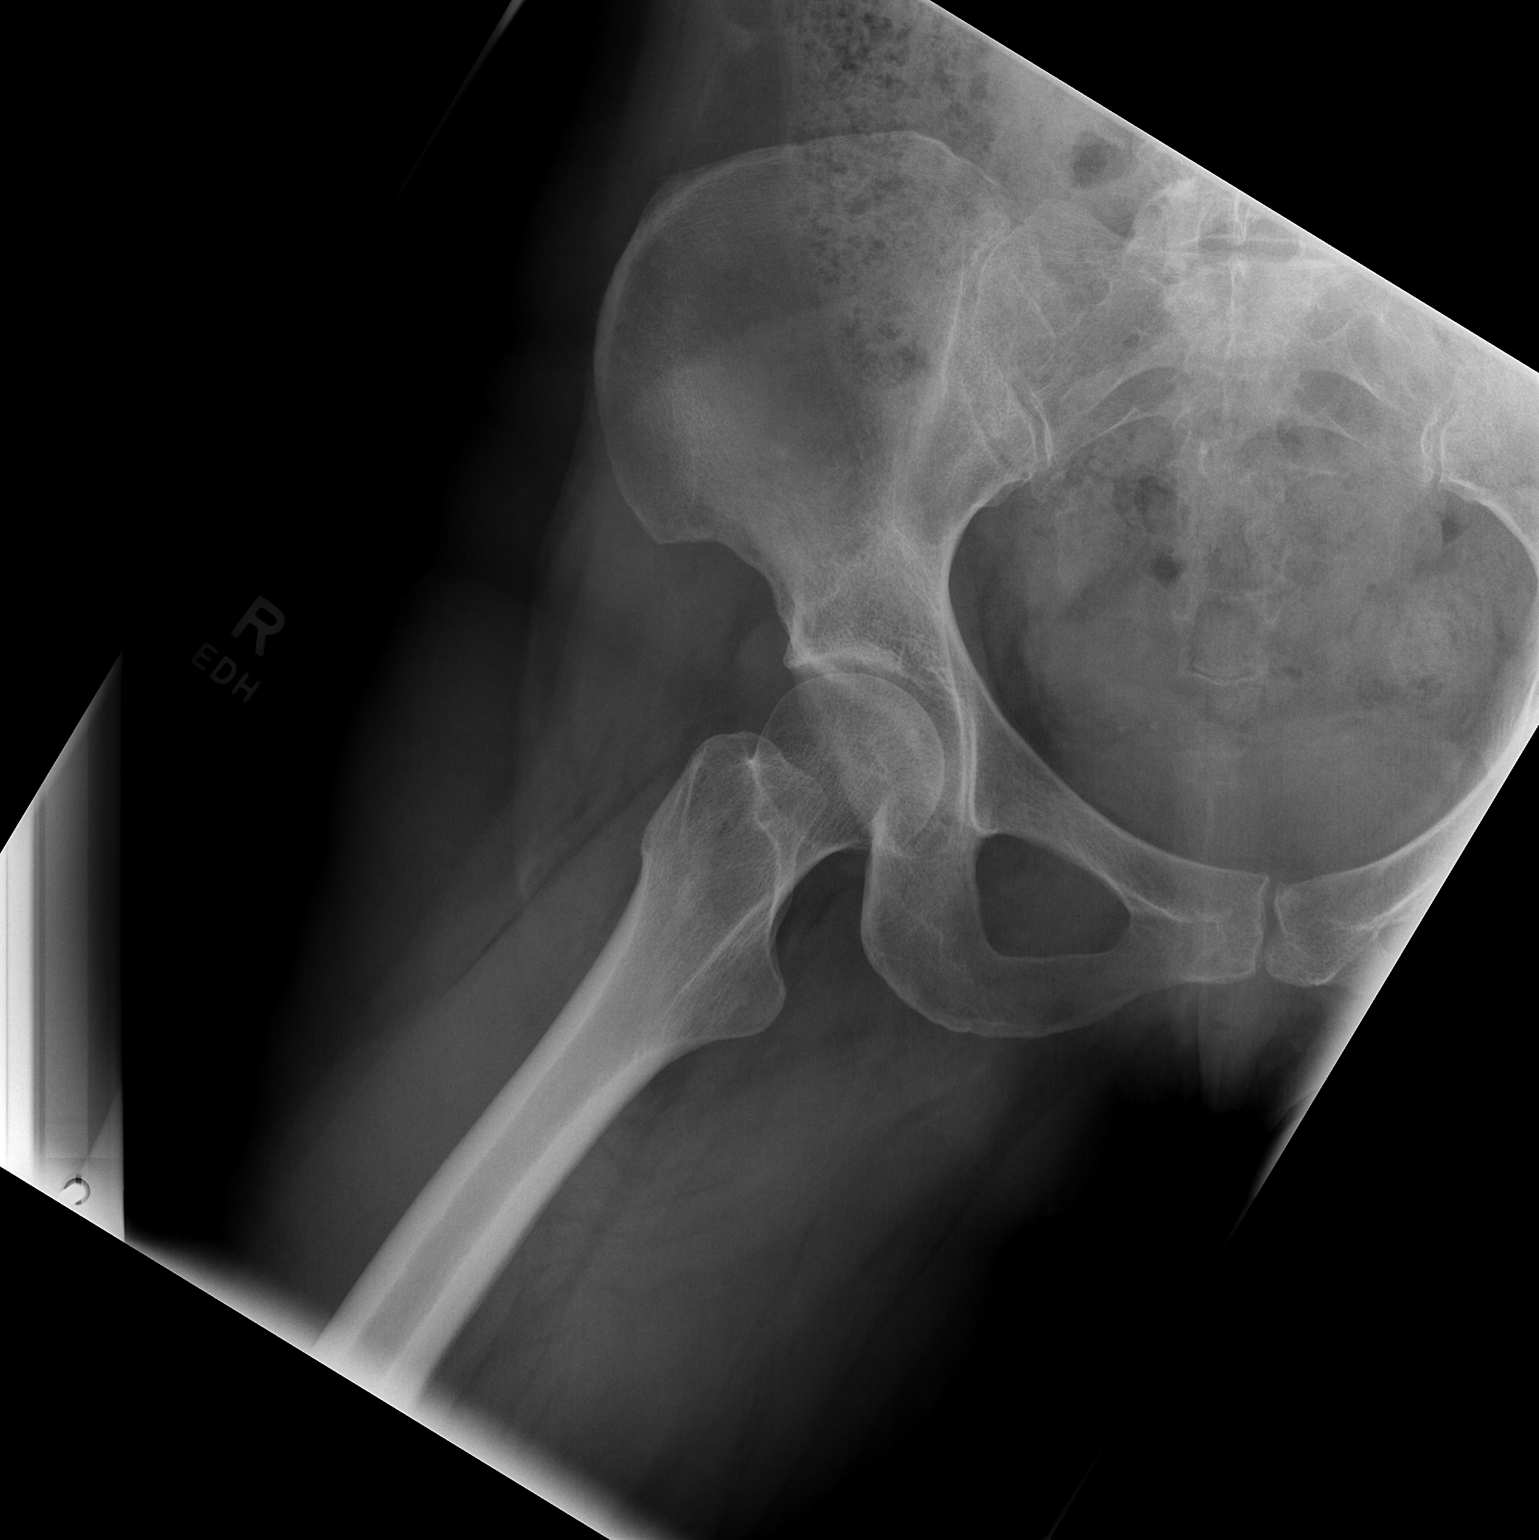

[2 of 2 positions shown; findings below may reference images not displayed]

FINDINGS: There is no evidence of hip fracture or dislocation. There is no
evidence of arthropathy or other focal bone abnormality.
IMPRESSION: No acute abnormality noted.

## 2021-05-26 DIAGNOSIS — L814 Other melanin hyperpigmentation: Secondary | ICD-10-CM | POA: Diagnosis not present

## 2021-05-26 DIAGNOSIS — D1801 Hemangioma of skin and subcutaneous tissue: Secondary | ICD-10-CM | POA: Diagnosis not present

## 2021-05-26 DIAGNOSIS — D2272 Melanocytic nevi of left lower limb, including hip: Secondary | ICD-10-CM | POA: Diagnosis not present

## 2021-05-26 DIAGNOSIS — D239 Other benign neoplasm of skin, unspecified: Secondary | ICD-10-CM | POA: Diagnosis not present

## 2021-10-23 DIAGNOSIS — Z0000 Encounter for general adult medical examination without abnormal findings: Secondary | ICD-10-CM | POA: Diagnosis not present

## 2021-10-23 DIAGNOSIS — E039 Hypothyroidism, unspecified: Secondary | ICD-10-CM | POA: Diagnosis not present

## 2021-10-30 DIAGNOSIS — Z23 Encounter for immunization: Secondary | ICD-10-CM | POA: Diagnosis not present

## 2021-10-30 DIAGNOSIS — Z1331 Encounter for screening for depression: Secondary | ICD-10-CM | POA: Diagnosis not present

## 2021-10-30 DIAGNOSIS — Z1339 Encounter for screening examination for other mental health and behavioral disorders: Secondary | ICD-10-CM | POA: Diagnosis not present

## 2021-10-30 DIAGNOSIS — Z0000 Encounter for general adult medical examination without abnormal findings: Secondary | ICD-10-CM | POA: Diagnosis not present

## 2022-02-16 DIAGNOSIS — E039 Hypothyroidism, unspecified: Secondary | ICD-10-CM | POA: Diagnosis not present

## 2022-05-04 DIAGNOSIS — D2272 Melanocytic nevi of left lower limb, including hip: Secondary | ICD-10-CM | POA: Diagnosis not present

## 2022-05-04 DIAGNOSIS — Z23 Encounter for immunization: Secondary | ICD-10-CM | POA: Diagnosis not present

## 2022-05-04 DIAGNOSIS — D239 Other benign neoplasm of skin, unspecified: Secondary | ICD-10-CM | POA: Diagnosis not present

## 2022-05-04 DIAGNOSIS — L821 Other seborrheic keratosis: Secondary | ICD-10-CM | POA: Diagnosis not present

## 2022-05-04 DIAGNOSIS — L814 Other melanin hyperpigmentation: Secondary | ICD-10-CM | POA: Diagnosis not present

## 2022-05-05 DIAGNOSIS — Z1231 Encounter for screening mammogram for malignant neoplasm of breast: Secondary | ICD-10-CM | POA: Diagnosis not present

## 2022-05-05 DIAGNOSIS — Z01419 Encounter for gynecological examination (general) (routine) without abnormal findings: Secondary | ICD-10-CM | POA: Diagnosis not present

## 2022-05-05 DIAGNOSIS — Z6821 Body mass index (BMI) 21.0-21.9, adult: Secondary | ICD-10-CM | POA: Diagnosis not present

## 2022-05-07 DIAGNOSIS — E039 Hypothyroidism, unspecified: Secondary | ICD-10-CM | POA: Diagnosis not present

## 2022-08-20 DIAGNOSIS — N390 Urinary tract infection, site not specified: Secondary | ICD-10-CM | POA: Diagnosis not present

## 2022-12-01 DIAGNOSIS — R7989 Other specified abnormal findings of blood chemistry: Secondary | ICD-10-CM | POA: Diagnosis not present

## 2022-12-01 DIAGNOSIS — E039 Hypothyroidism, unspecified: Secondary | ICD-10-CM | POA: Diagnosis not present

## 2022-12-08 DIAGNOSIS — Z0000 Encounter for general adult medical examination without abnormal findings: Secondary | ICD-10-CM | POA: Diagnosis not present

## 2022-12-08 DIAGNOSIS — Z1331 Encounter for screening for depression: Secondary | ICD-10-CM | POA: Diagnosis not present

## 2022-12-08 DIAGNOSIS — G43909 Migraine, unspecified, not intractable, without status migrainosus: Secondary | ICD-10-CM | POA: Diagnosis not present

## 2022-12-08 DIAGNOSIS — Z1339 Encounter for screening examination for other mental health and behavioral disorders: Secondary | ICD-10-CM | POA: Diagnosis not present

## 2023-05-05 DIAGNOSIS — L821 Other seborrheic keratosis: Secondary | ICD-10-CM | POA: Diagnosis not present

## 2023-05-05 DIAGNOSIS — L578 Other skin changes due to chronic exposure to nonionizing radiation: Secondary | ICD-10-CM | POA: Diagnosis not present

## 2023-05-05 DIAGNOSIS — L814 Other melanin hyperpigmentation: Secondary | ICD-10-CM | POA: Diagnosis not present

## 2023-05-05 DIAGNOSIS — D229 Melanocytic nevi, unspecified: Secondary | ICD-10-CM | POA: Diagnosis not present

## 2023-08-26 DIAGNOSIS — J101 Influenza due to other identified influenza virus with other respiratory manifestations: Secondary | ICD-10-CM | POA: Diagnosis not present

## 2023-08-26 DIAGNOSIS — R0981 Nasal congestion: Secondary | ICD-10-CM | POA: Diagnosis not present

## 2023-08-26 DIAGNOSIS — R058 Other specified cough: Secondary | ICD-10-CM | POA: Diagnosis not present

## 2023-12-14 DIAGNOSIS — E039 Hypothyroidism, unspecified: Secondary | ICD-10-CM | POA: Diagnosis not present

## 2023-12-21 DIAGNOSIS — Z1331 Encounter for screening for depression: Secondary | ICD-10-CM | POA: Diagnosis not present

## 2023-12-21 DIAGNOSIS — Z1339 Encounter for screening examination for other mental health and behavioral disorders: Secondary | ICD-10-CM | POA: Diagnosis not present

## 2023-12-21 DIAGNOSIS — G43909 Migraine, unspecified, not intractable, without status migrainosus: Secondary | ICD-10-CM | POA: Diagnosis not present

## 2023-12-21 DIAGNOSIS — Z23 Encounter for immunization: Secondary | ICD-10-CM | POA: Diagnosis not present

## 2023-12-21 DIAGNOSIS — Z0000 Encounter for general adult medical examination without abnormal findings: Secondary | ICD-10-CM | POA: Diagnosis not present

## 2023-12-30 DIAGNOSIS — R5383 Other fatigue: Secondary | ICD-10-CM | POA: Diagnosis not present

## 2024-01-13 DIAGNOSIS — Z01419 Encounter for gynecological examination (general) (routine) without abnormal findings: Secondary | ICD-10-CM | POA: Diagnosis not present

## 2024-01-13 DIAGNOSIS — Z124 Encounter for screening for malignant neoplasm of cervix: Secondary | ICD-10-CM | POA: Diagnosis not present

## 2024-01-13 DIAGNOSIS — Z1231 Encounter for screening mammogram for malignant neoplasm of breast: Secondary | ICD-10-CM | POA: Diagnosis not present

## 2024-01-13 DIAGNOSIS — Z1331 Encounter for screening for depression: Secondary | ICD-10-CM | POA: Diagnosis not present

## 2024-04-06 DIAGNOSIS — Z23 Encounter for immunization: Secondary | ICD-10-CM | POA: Diagnosis not present

## 2024-05-03 DIAGNOSIS — D229 Melanocytic nevi, unspecified: Secondary | ICD-10-CM | POA: Diagnosis not present

## 2024-05-03 DIAGNOSIS — L814 Other melanin hyperpigmentation: Secondary | ICD-10-CM | POA: Diagnosis not present

## 2024-05-03 DIAGNOSIS — L821 Other seborrheic keratosis: Secondary | ICD-10-CM | POA: Diagnosis not present
